# Patient Record
Sex: Male | Born: 1975 | Race: White | Hispanic: No | Marital: Married | Smoking: Current every day smoker
Health system: Southern US, Community
[De-identification: ages and names within clinical notes are randomized; demographics above are authoritative.]

## PROBLEM LIST (undated history)

## (undated) DIAGNOSIS — E785 Hyperlipidemia, unspecified: Secondary | ICD-10-CM

---

## 2009-09-03 ENCOUNTER — Ambulatory Visit: Payer: Self-pay | Admitting: Family Medicine

## 2009-09-03 DIAGNOSIS — E785 Hyperlipidemia, unspecified: Secondary | ICD-10-CM

## 2009-09-03 DIAGNOSIS — K047 Periapical abscess without sinus: Secondary | ICD-10-CM

## 2010-07-08 NOTE — Assessment & Plan Note (Signed)
Summary: NOV dental problems/ high cholesterol   Vital Signs:  Patient profile:   35 year old male Height:      73 inches Weight:      156 pounds BMI:     20.66 O2 Sat:      98 % on Room air Temp:     98.6 degrees F oral Pulse rate:   85 / minute BP sitting:   111 / 64  (left arm) Cuff size:   regular  Vitals Entered By: Payton Spark CMA (September 03, 2009 3:47 PM)  O2 Flow:  Room air CC: New to est. Impacted wisdom tooth infection and pain. Also needs cholesterol checked- has been off meds x 2 yrs.  Pain Assessment Patient in pain? yes     Location: teeth   Primary Care Provider:  Seymour Bars DO  CC:  New to est. Impacted wisdom tooth infection and pain. Also needs cholesterol checked- has been off meds x 2 yrs. .  History of Present Illness: 35 yo WM presents for NOV.  He has hx of hereditary hyperlipidemia.  He has poor dentition and has been w/o insurance for the past couple years.  He is currently having painful teeth on the upper L side alll the way up to his temple.  He finished a round of abx 2 wks ago for an abscess on the R side of his mouth.  He is going to get in with a dentist soon.  He had subjective fevers last night.  Using Excedrin for pain.  He had been on cholesterol meds since age 75 until he was 59.  He has done well on muliptle combined agents.  His father died of AMI at 95 and his PGF died of AMI at 73.  He is a smoker.  Has not had any cardiac testing done.        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 Divorced.  Lives with GF, her son and his brother. No kids. Smokes  1/2 ppds x 18 yrs. No regular exercise.  Review of Systems       no fevers/sweats/weakness, unexplained wt loss/gain, no change in vision, no difficulty hearing, ringing in ears, no hay fever/allergies, no CP/discomfort, no palpitations, no breast lump/nipple discharge, no cough/wheeze, no blood in stool, no N/V/D, no nocturia, no leaking urine, no unusual vag bleeding, no vaginal/penile discharge, no muscle/joint pain, no rash, no new/changing mole, no HA, no memory loss, no anxiety, no sleep problem, no depression, no unexplained lumps, no easy bruising/bleeding, no concern with sexual function   Physical Exam  General:  alert, well-developed, well-nourished, and well-hydrated.  thin build Head:  normocephalic, atraumatic, and no alopecia.   Eyes:  pupils equal, pupils round, and pupils reactive to light.   Nose:  no nasal discharge.   Mouth:  poor dentition and gingival inflammation.  abscess L lower side Neck:  no masses.   Lungs:  Normal respiratory effort, chest expands symmetrically. Lungs are clear to auscultation, no crackles or wheezes. Heart:  Normal rate and regular rhythm. S1 and S2 normal without gallop, murmur, click, rub or other extra sounds. Skin:  color normal.  Cervical Nodes:  No lymphadenopathy noted Psych:  good eye contact, not anxious appearing, and not depressed appearing.     Impression & Recommendations:  Problem # 1:  ABSCESS, TOOTH (ICD-522.5) Treat with Augmentin x 10 days for dental infection. Use Advil during the day and Vicodin at night for pain. He is going to call the dentist to get in ASAP.  Problem # 2:  HYPERLIPIDEMIA (ICD-272.4) Update labs and restart meds.  Due to his hx, he may be a good candidate for the lipid clinic thru [facility]  and with fam hx of ACS at such a young age, he will be due for ETT.  Strongly encouraged smokign cessation. Orders: T-Lipid Profile ([id number])  Complete Medication List: 1)  Augmentin 500-125 Mg Tabs  (Amoxicillin-pot clavulanate) .Marland Kitchen.. 1 tab by mouth two times a day x 10 days 2)  Vicodin 5-500 Mg Tabs (Hydrocodone-acetaminophen) .Marland Kitchen.. 1-2 tab by mouth once daily as needed for dental pain 3)  Advil 200 Mg Tabs (Ibuprofen) .... 4 tabs by mouth three times a day with food as needed for pain  Other Orders: T-Comprehensive Metabolic Panel ([id number])  Patient Instructions: 1)  Use 10 days of Augmentin for dental abscess. 2)  Use Advil 800 mg 3 x a day with food for pain and inflammation along with Hydrococodone at bedtime for pain. 3)  Use ice packs as needed. 4)  Update fasting labs. 5)  will call you w/ results. Prescriptions: VICODIN 5-500 MG TABS (HYDROCODONE-ACETAMINOPHEN) 1-2 tab by mouth once daily as needed for dental pain  #20 x 0   Entered and Authorized by:   Seymour Bars DO   Signed by:   Seymour Bars DO on 09/03/2009   Method used:   Printed then faxed to ...       [address] [phone]* (retail)       [address] [place], [place]  [address]       Ph: [phone]       Fax: [phone]   RxID:   [id number] AUGMENTIN 500-125 MG TABS (AMOXICILLIN-POT CLAVULANATE) 1 tab by mouth two times a day x 10 days  #20 x 0   Entered and Authorized by:   Seymour Bars DO   Signed by:   Seymour Bars DO on 09/03/2009   Method used:   Electronically to        [facility] [phone]* (retail)       [address] [place], [place]  [address]       Ph: [phone]       Fax: [phone]   RxID:   [id number]

## 2011-02-02 ENCOUNTER — Other Ambulatory Visit: Payer: Self-pay | Admitting: Family Medicine

## 2011-02-02 ENCOUNTER — Encounter: Payer: Self-pay | Admitting: Family Medicine

## 2011-02-02 ENCOUNTER — Ambulatory Visit
Admission: RE | Admit: 2011-02-02 | Discharge: 2011-02-02 | Disposition: A | Discharge status: Home / Self Care | Payer: PRIVATE HEALTH INSURANCE | Source: Ambulatory Visit | Attending: Family Medicine | Admitting: Family Medicine

## 2011-02-02 ENCOUNTER — Inpatient Hospital Stay (INDEPENDENT_AMBULATORY_CARE_PROVIDER_SITE_OTHER)
Admission: RE | Admit: 2011-02-02 | Discharge: 2011-02-02 | Disposition: A | Discharge status: Home / Self Care | Payer: PRIVATE HEALTH INSURANCE | Source: Ambulatory Visit | Attending: Family Medicine | Admitting: Family Medicine

## 2011-02-02 DIAGNOSIS — K5289 Other specified noninfective gastroenteritis and colitis: Secondary | ICD-10-CM

## 2011-02-02 DIAGNOSIS — R1032 Left lower quadrant pain: Secondary | ICD-10-CM

## 2011-02-02 LAB — CONVERTED CEMR LAB
Blood in Urine, dipstick: NEGATIVE
Glucose, Urine, Semiquant: NEGATIVE
Nitrite: NEGATIVE
Protein, U semiquant: NEGATIVE
Urobilinogen, UA: 0.2

## 2011-02-04 ENCOUNTER — Telehealth (INDEPENDENT_AMBULATORY_CARE_PROVIDER_SITE_OTHER): Payer: Self-pay | Admitting: *Deleted

## 2011-02-10 ENCOUNTER — Encounter: Payer: Self-pay | Admitting: Emergency Medicine

## 2011-02-10 ENCOUNTER — Inpatient Hospital Stay (INDEPENDENT_AMBULATORY_CARE_PROVIDER_SITE_OTHER)
Admission: RE | Admit: 2011-02-10 | Discharge: 2011-02-10 | Disposition: A | Discharge status: Home / Self Care | Payer: PRIVATE HEALTH INSURANCE | Source: Ambulatory Visit | Attending: Emergency Medicine | Admitting: Emergency Medicine

## 2011-02-10 DIAGNOSIS — J069 Acute upper respiratory infection, unspecified: Secondary | ICD-10-CM

## 2011-05-11 NOTE — Progress Notes (Signed)
Summary: possible kidney stone? (rm 4)   Vital Signs:  Patient Profile:   35 Years Old Male CC:      diarrhea, nausea, left lower abdominal pain x this AM Height:     73 inches Weight:      151 pounds O2 Sat:      100 % O2 treatment:    Room Air Temp:     97.8 degrees F oral Pulse rate:   79 / minute Resp:     16 per minute BP sitting:   122 / 78  (left arm) Cuff size:   regular  Pt. in pain?   yes    Location:   lower abdomen    Type:       sharp and dull  Vitals Entered By: Lajean Saver RN (February 02, 2011 9:54 AM)                   Prior Medication List:  AUGMENTIN 500-125 MG TABS (AMOXICILLIN-POT CLAVULANATE) 1 tab by mouth two times a day x 10 days VICODIN 5-500 MG TABS (HYDROCODONE-ACETAMINOPHEN) 1-2 tab by mouth once daily as needed for dental pain ADVIL 200 MG TABS (IBUPROFEN) 4 tabs by mouth three times a day with food as needed for pain   Updated Prior Medication List: LIPITOR 80 MG TABS (ATORVASTATIN CALCIUM)   Current Allergies: No known allergies History of Present Illness Chief Complaint: diarrhea, nausea, left lower abdominal pain x this AM History of Present Illness: Patient is here for episodic abdominal pain that started this AM . he states never having a  kidney stone before  but he has ben having episodic abdominal pain that comes in waves. The pain varies from a 10 to a 4 w/some nauseaand sdiarrhea. The pain has been on his lower left side. He denies any previous abdominal surgeries.  Current Problems: COLITIS, ACUTE (ICD-558.9) ABDOMINAL PAIN, LEFT LOWER QUADRANT (ICD-789.04) ABSCESS, TOOTH (ICD-522.5) OTH&UNSPEC ENDOCRN NUTRIT METAB&IMMUNITY D/O (ICD-V77.99) HYPERLIPIDEMIA (ICD-272.4)   Current Meds LIPITOR 80 MG TABS (ATORVASTATIN CALCIUM)  FLAGYL 500 MG TABS (METRONIDAZOLE) 1 by mouth twice a day AUGMENTIN 875-125 MG TABS (AMOXICILLIN-POT CLAVULANATE) 1 by mouth 2 times daily HYDROCODONE-ACETAMINOPHEN 5-325 MG TABS  (HYDROCODONE-ACETAMINOPHEN) 1 by mouth q 8hrs as needed for pain ZOFRAN ODT 8 MG TBDP (ONDANSETRON) 1 by mouth q 8hrs as needed for diarrhea  REVIEW OF SYSTEMS Constitutional Symptoms      Denies fever, chills, night sweats, weight loss, weight gain, and fatigue.  Eyes       Denies change in vision, eye pain, eye discharge, glasses, contact lenses, and eye surgery. Ear/Nose/Throat/Mouth       Denies hearing loss/aids, change in hearing, ear pain, ear discharge, dizziness, frequent runny nose, frequent nose bleeds, sinus problems, sore throat, hoarseness, and tooth pain or bleeding.  Respiratory       Denies dry cough, productive cough, wheezing, shortness of breath, asthma, bronchitis, and emphysema/COPD.  Cardiovascular       Denies murmurs, chest pain, and tires easily with exhertion.    Gastrointestinal       Complains of stomach pain and diarrhea.      Denies nausea/vomiting, constipation, blood in bowel movements, and indigestion.      Comments: Nausea Genitourniary       Denies painful urination, kidney stones, and loss of urinary control. Neurological       Denies paralysis, seizures, and fainting/blackouts. Musculoskeletal       Denies muscle pain, joint pain, joint stiffness,  decreased range of motion, redness, swelling, muscle weakness, and gout.  Skin       Denies bruising, unusual mles/lumps or sores, and hair/skin or nail changes.  Psych       Denies mood changes, temper/anger issues, anxiety/stress, speech problems, depression, and sleep problems. Other Comments: Patient awoke this AM with nausea and diarrhea. He c/o lower abdominal pain mostly on the left. pain is dull with sharp pains @ a 10/10 @ times   Past History:  Family History: Last updated: 09/22/2009 father AMI, died at 29, high cholesterol PGF died of AMI 35, high cholesterol M fibromyalgia 1/2 brother healthy  Social History: Last updated: 02/02/2011 [employer]. Moved here from  [place]. No kids. Smokes 1/2 ppds x 18 yrs. No regular exercise. Married  Past Medical History: Reviewed history from 09/22/09 and no changes required. hereditary hyperlipidemia periodontal dz  Past Surgical History: Reviewed history from 09/22/2009 and no changes required. t tubes  Family History: Reviewed history from 09-22-09 and no changes required. father AMI, died at 52, high cholesterol PGF died of AMI 15, high cholesterol M fibromyalgia 1/2 brother healthy  Social History: Reviewed history from 09/22/09 and no changes required. [employer]. Moved here from [place]. No kids. Smokes 1/2 ppds x 18 yrs. No regular exercise. Married Physical Exam General appearance: well developed, well nourished,mild to moderate discomfort Head: normocephalic, atraumatic Chest/Lungs: no rales, wheezes, or rhonchi bilateral, breath sounds equal without effort Heart: regular rate and  rhythm, no murmur Abdomen: tenderness in L lower Quadrant Back: no CVA tendrness Skin: no obvious rashes or lesions MSE: oriented to time, place, and person Assessment New Problems: COLITIS, ACUTE (ICD-558.9) ABDOMINAL PAIN, LEFT LOWER QUADRANT (ICD-789.04)   Patient Education: Patient and/or caregiver instructed in the following: rest fluids and Tylenol, quit smoking.  Plan New Medications/Changes: ZOFRAN ODT 8 MG TBDP (ONDANSETRON) 1 by mouth q 8hrs as needed for diarrhea  #20 x 0, 02/02/2011, Hassan Rowan MD HYDROCODONE-ACETAMINOPHEN 5-325 MG TABS (HYDROCODONE-ACETAMINOPHEN) 1 by mouth q 8hrs as needed for pain  #12 x 0, 02/02/2011, Hassan Rowan MD AUGMENTIN 210 154 0152 MG TABS (AMOXICILLIN-POT CLAVULANATE) 1 by mouth 2 times daily  #20 x 0, 02/02/2011, Hassan Rowan MD FLAGYL 500 MG TABS (METRONIDAZOLE) 1 by mouth twice a day  #14 x 0, 02/02/2011, Hassan Rowan MD ZOFRAN ODT 8 MG TBDP (ONDANSETRON) 1 by mouth q 8hrs as needed for diarrhea  #20 x 0, 02/02/2011, Hassan Rowan  MD HYDROCODONE-ACETAMINOPHEN 5-325 MG TABS (HYDROCODONE-ACETAMINOPHEN) 1 by mouth q 8hrs as needed for pain  #12 x 0, 02/02/2011, Hassan Rowan MD AUGMENTIN 875-125 MG TABS (AMOXICILLIN-POT CLAVULANATE) 1 by mouth 2 times daily  #20 x 0, 02/02/2011, Hassan Rowan MD FLAGYL 500 MG TABS (METRONIDAZOLE) 1 by mouth twice a day  #14 x 0, 02/02/2011, Hassan Rowan MD  New Orders: T-CT Abdomen/pelvis w/o [81191] UA Dipstick w/o Micro (manual) [81002] Ketorolac-Toradol 15mg  [J1885] Admin of Therapeutic Inj  intramuscular or subcutaneous [96372] Est. Patient Level IV [47829] CBC w/Diff [[id number]] Follow Up: Follow up in 2-3 days if no improvement Work/School Excuse: Return to work/school in 2 days  The patient and/or caregiver has been counseled thoroughly with regard to medications prescribed including dosage, schedule, interactions, rationale for use, and possible side effects and they verbalize understanding.  Diagnoses and expected course of recovery discussed and will return if not improved as expected or if the condition worsens. Patient and/or caregiver verbalized understanding.  Prescriptions: ZOFRAN ODT 8 MG TBDP (  ONDANSETRON) 1 by mouth q 8hrs as needed for diarrhea  #20 x 0   Entered and Authorized by:   Hassan Rowan MD   Signed by:   Hassan Rowan MD on 02/02/2011   Method used:   Printed then faxed to ...       [address] [phone]* (retail)       [address] [place], [place]  [address]       Ph: [phone]       Fax: [phone]   RxID:   [id number] HYDROCODONE-ACETAMINOPHEN 5-325 MG TABS (HYDROCODONE-ACETAMINOPHEN) 1 by mouth q 8hrs as needed for pain  #12 x 0   Entered and Authorized by:   Hassan Rowan MD   Signed by:   Hassan Rowan MD on 02/02/2011   Method used:   Printed then faxed to ...       [address] [phone]* (retail)       [address] [place], [place]  [address]       Ph: [phone]       Fax: [phone]   RxID:   [id number] AUGMENTIN  875-125 MG TABS (AMOXICILLIN-POT CLAVULANATE) 1 by mouth 2 times daily  #20 x 0   Entered and Authorized by:   Hassan Rowan MD   Signed by:   Hassan Rowan MD on 02/02/2011   Method used:   Printed then faxed to ...       [address] [phone]* (retail)       [address] [place], [place]  [address]       Ph: [phone]       Fax: [phone]   RxID:   [id number] FLAGYL 500 MG TABS (METRONIDAZOLE) 1 by mouth twice a day  #14 x 0   Entered and Authorized by:   Hassan Rowan MD   Signed by:   Hassan Rowan MD on 02/02/2011   Method used:   Printed then faxed to ...       [address] [phone]* (retail)       [address] [place], [place]  [address]       Ph: [phone]       Fax: [phone]   RxID:   [id number] ZOFRAN ODT 8 MG TBDP (ONDANSETRON) 1 by mouth q 8hrs as needed for diarrhea  #20 x 0   Entered and Authorized by:   Hassan Rowan MD   Signed by:   Hassan Rowan MD on 02/02/2011   Method used:   Printed then faxed to ...       [address] [phone]* (retail)       [address] [place], [place]  [address]       Ph: [phone]       Fax: [phone]   RxID:   [id number] HYDROCODONE-ACETAMINOPHEN 5-325 MG TABS (HYDROCODONE-ACETAMINOPHEN) 1 by mouth q 8hrs as needed for pain  #12 x 0   Entered and Authorized by:   Hassan Rowan MD   Signed by:   Hassan Rowan MD on 02/02/2011   Method used:   Printed then faxed to ...       [facility] [phone]* (retail)       [address] [place], [place]  [address]       Ph: [phone]       Fax: [phone]   RxID:   [id number] AUGMENTIN 875-125 MG TABS (AMOXICILLIN-POT CLAVULANATE) 1 by mouth 2 times daily  #20 x 0   Entered and Authorized by:   Hassan Rowan MD   Signed by:   Hassan Rowan MD on 02/02/2011   Method used:   Printed then faxed to ...       [address] [phone]* (retail)       [address] [place], [place]  [address]       Ph: [phone]       Fax: [phone]   RxID:    [id number] FLAGYL 500 MG TABS (METRONIDAZOLE) 1 by mouth twice a day  #14 x 0   Entered and Authorized by:   Hassan Rowan MD   Signed by:   Hassan Rowan MD on 02/02/2011   Method used:   Printed then faxed to ...       [address] [phone]* (retail)       [address] [place], [place]  [address]       Ph: [phone]       Fax: [phone]   RxID:   [id number]   Patient Instructions: 1)  Please schedule a follow-up appointment as needed. 2)  Please schedule an appointment with your primary doctor in :3-10 days if needed 3)  Treating the colitis w/antibiotic but more scans may be neeeded if not improving 4)  Tobacco is very bad for your health and your loved ones! You Should stop smoking!. 5)  Stop Smoking Tips: Choose a Quit date. Cut down before the Quit date. decide what you will do as a substitute when you feel the urge to smoke(gum,toothpick,exercise). 6)  Medically cleared to return to work; return to work note provided.  Medication Administration  Injection # 1:    Medication: Ketorolac-Toradol 15mg     Diagnosis: ABDOMINAL PAIN, LEFT LOWER QUADRANT (ICD-789.04)    Route: IM    Site: RUOQ gluteus    Exp Date: 08/06/2012    Lot #: [id number]    Mfr: hospira    Comments: 76m,g given    Patient tolerated injection without complications    Given by: Lajean Saver RN (February 02, 2011 10:56 AM)  Orders Added: 1)  T-CT Abdomen/pelvis w/o [94854] 2)  UA Dipstick w/o Micro (manual) [81002] 3)  Ketorolac-Toradol 15mg  [J1885] 4)  Admin of Therapeutic Inj  intramuscular or subcutaneous [96372] 5)  Est. Patient Level IV [62703] 6)  CBC w/Diff [[id number]]    Laboratory Results   Urine Tests  Date/Time Received: February 02, 2011 10:23 AM  Date/Time Reported: February 02, 2011 10:23 AM   Routine Urinalysis   Color: yellow Appearance: Clear Glucose: negative   (Normal Range: Negative) Bilirubin: negative   (Normal Range: Negative) Ketone: negative   (Normal  Range: Negative) Spec. Gravity: 1.025   (Normal Range: 1.003-1.035) Blood: negative   (Normal Range: Negative) pH: 5.5   (Normal Range: 5.0-8.0) Protein: negative   (Normal Range: Negative) Urobilinogen: 0.2   (Normal Range: 0-1) Nitrite: negative   (Normal Range: Negative) Leukocyte Esterace: negative   (Normal Range: Negative)

## 2011-05-11 NOTE — Progress Notes (Signed)
Summary: Fever/URI with sore throat (room 4)   Vital Signs:  Patient Profile:   35 Years Old Male CC:      URI/ sore throat Height:     73 inches Weight:      150 pounds O2 Sat:      97 % O2 treatment:    Room Air Temp:     100.3 degrees F oral Pulse rate:   18 / minute Resp:     18 per minute BP sitting:   123 / 85  (left arm) Cuff size:   regular  Pt. in pain?   yes    Location:   general body; throats  Vitals Entered By: Lavell Islam RN (February 10, 2011 5:55 PM)                   Updated Prior Medication List: LIPITOR 80 MG TABS (ATORVASTATIN CALCIUM)  FLAGYL 500 MG TABS (METRONIDAZOLE) 1 by mouth twice a day AUGMENTIN 875-125 MG TABS (AMOXICILLIN-POT CLAVULANATE) 1 by mouth 2 times daily HYDROCODONE-ACETAMINOPHEN 5-325 MG TABS (HYDROCODONE-ACETAMINOPHEN) 1 by mouth q 8hrs as needed for pain  Current Allergies: No known allergies History of Present Illness History from: patient Chief Complaint: URI/ sore throat History of Present Illness: 35 Years Old Male complains of onset of cold symptoms for 1 days.  Jonathan Anthony has been using Flagyl & Augmentin for his diverticulitis flare which helped him a lot and that pain is gone.  He is still taking those meds.  He has been around multiple people who have been sick. + sore throat No cough No pleuritic pain No wheezing + nasal congestion + post-nasal drainage + sinus pain/pressure No chest congestion No itchy/red eyes No earache No hemoptysis No SOB No chills/sweats + fever No nausea No vomiting No abdominal pain No diarrhea No skin rashes + fatigue + myalgias No headache   REVIEW OF SYSTEMS Constitutional Symptoms       Complains of fever and fatigue.     Denies chills, night sweats, weight loss, and weight gain.  Eyes       Complains of change in vision.      Denies eye pain, eye discharge, glasses, contact lenses, and eye surgery. Ear/Nose/Throat/Mouth       Complains of ear pain, frequent runny  nose, sinus problems, sore throat, and hoarseness.      Denies hearing loss/aids, change in hearing, ear discharge, dizziness, frequent nose bleeds, and tooth pain or bleeding.  Respiratory       Complains of dry cough.      Denies productive cough, wheezing, shortness of breath, asthma, bronchitis, and emphysema/COPD.  Cardiovascular       Denies murmurs, chest pain, and tires easily with exhertion.    Gastrointestinal       Complains of nausea/vomiting.      Denies stomach pain, diarrhea, constipation, blood in bowel movements, and indigestion. Genitourniary       Denies painful urination, kidney stones, and loss of urinary control. Neurological       Complains of numbness and tingling.      Denies paralysis, seizures, and fainting/blackouts. Musculoskeletal       Complains of joint stiffness and muscle weakness.      Denies muscle pain, joint pain, decreased range of motion, redness, swelling, and gout.  Skin       Denies bruising, unusual mles/lumps or sores, and hair/skin or nail changes.  Psych       Denies mood changes,  temper/anger issues, anxiety/stress, speech problems, depression, and sleep problems. Other Comments: URI and sore throat x 24 hours   Past History:  Past Medical History: Reviewed history from 09/03/2009 and no changes required. hereditary hyperlipidemia periodontal dz  Past Surgical History: Reviewed history from 09/03/2009 and no changes required. t tubes  Family History: Reviewed history from 09/03/2009 and no changes required. father AMI, died at 78, high cholesterol PGF died of AMI 79, high cholesterol M fibromyalgia 1/2 brother healthy  Social History: Reviewed history from 02/02/2011 and no changes required. [employer]. Moved here from [place]. No kids. Smokes 1/2 ppds x 18 yrs. No regular exercise. Physical Exam General appearance: well developed, well nourished, no acute distress Ears: normal, no lesions or  deformities Nasal: mucosa pink, nonedematous, no septal deviation, turbinates normal Oral/Pharynx: tongue normal, posterior pharynx without erythema or exudate Chest/Lungs: no rales, wheezes, or rhonchi bilateral, breath sounds equal without effort Heart: regular rate and  rhythm, no murmur MSE: oriented to time, place, and person Assessment New Problems: UPPER RESPIRATORY INFECTION, ACUTE (ICD-465.9)   Plan New Medications/Changes: PREDNISONE (PAK) 10 MG TABS (PREDNISONE) 6 day pack, use as directed  #1 x 0, 02/10/2011, Hoyt Koch MD  New Orders: Est. Patient Level III [91478] Rapid Strep [29562] Planning Comments:   1)  Rapid strep neg, as expected due to being on Augmentin already.  Likely viral URI.  Rx for Pred pack to hold and can take if getting worse. 2)  Use nasal saline solution (over the counter) at least 3 times a day. 3)  Use over the counter decongestants like Zyrtec-D every 12 hours as needed to help with congestion. 4)  Can take tylenol every 6 hours or motrin every 8 hours for pain or fever. 5)  Follow up with your primary doctor  if no improvement in 5-7 days, sooner if increasing pain, fever, or new symptoms.    The patient and/or caregiver has been counseled thoroughly with regard to medications prescribed including dosage, schedule, interactions, rationale for use, and possible side effects and they verbalize understanding.  Diagnoses and expected course of recovery discussed and will return if not improved as expected or if the condition worsens. Patient and/or caregiver verbalized understanding.  Prescriptions: PREDNISONE (PAK) 10 MG TABS (PREDNISONE) 6 day pack, use as directed  #1 x 0   Entered and Authorized by:   Hoyt Koch MD   Signed by:   Hoyt Koch MD on 02/10/2011   Method used:   Print then Give to Patient   RxID:   [id number]   Orders Added: 1)  Est. Patient Level III [84132] 2)  Rapid Strep [44010]    Laboratory  Results  Date/Time Received: February 10, 2011 6:02 PM  Date/Time Reported: February 10, 2011 6:02 PM   Other Tests  Rapid Strep: negative  Kit Test Internal QC: Negative   (Normal Range: Negative)

## 2011-05-11 NOTE — Letter (Signed)
Summary: Out of Work  [facility]  [address]   [place], [place] [address]   Phone: [phone]  Fax: [phone]    February 02, 2011   Employee:  Staci Righter    To Whom It May Concern:   For Medical reasons, please excuse the above named employee from work for the following dates:  Start:   02/02/2011  End:   02/04/2011  If you need additional information, please feel free to contact our office.         Sincerely,    Hassan Rowan MD

## 2011-05-11 NOTE — Telephone Encounter (Signed)
  Phone Note Outgoing Call Call back at Home Phone [phone] [facility]     Call placed by: Lajean Saver RN,  February 04, 2011 4:31 PM Call placed to: Patient Action Taken: Phone Call Completed Summary of Call: Callback:Patient reports he is taking the antibiotics and returned to work today.

## 2013-06-15 IMAGING — CT CT ABD-PELV W/O CM
2 of 4 series · 16 of 42 positions shown, 19 images · non-contrast
Comparison: None

CLINICAL DATA: Left lower quadrant abdominal pain.  Rule out
stones.

CT ABDOMEN AND PELVIS WITHOUT CONTRAST (CT UROGRAM)
TECHNIQUE: Contiguous axial images of the abdomen and pelvis
without oral or intravenous contrast were obtained.

[Series 2: renal stone w/o · axial · non-contrast · 0.70mm/px · z∈[-385,-45]mm · 13 of 77 slices shown, 16 images]
[im 6/77  soft-tissue]
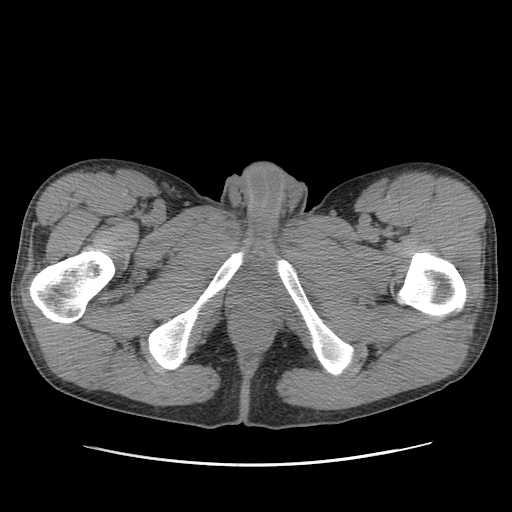
[im 6/77  bone]
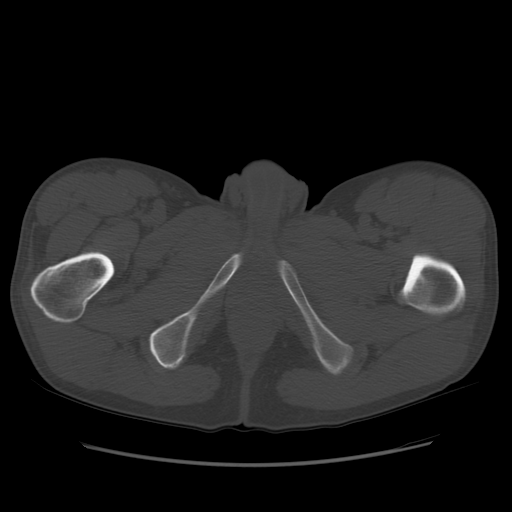
[im 14/77  soft-tissue]
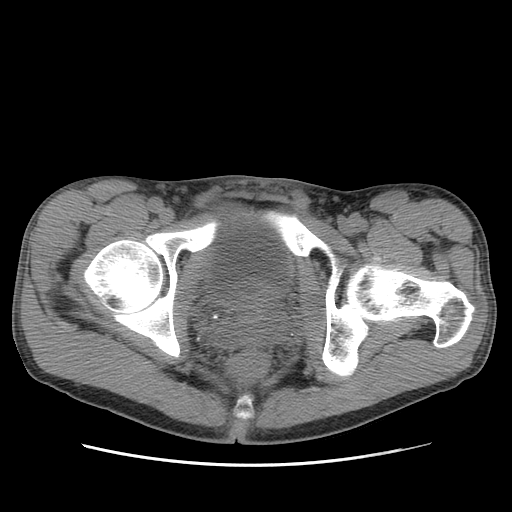
[im 20/77  soft-tissue]
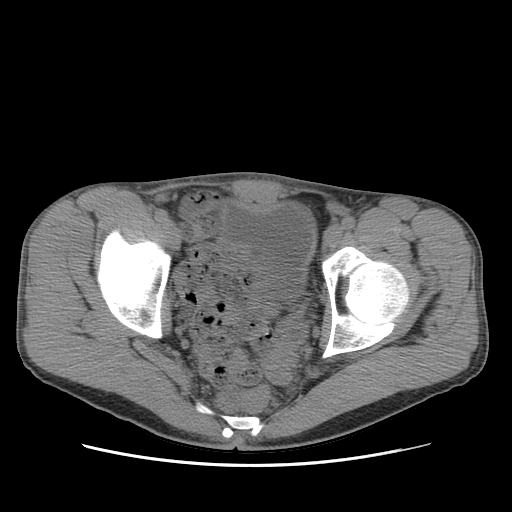
[im 28/77  soft-tissue]
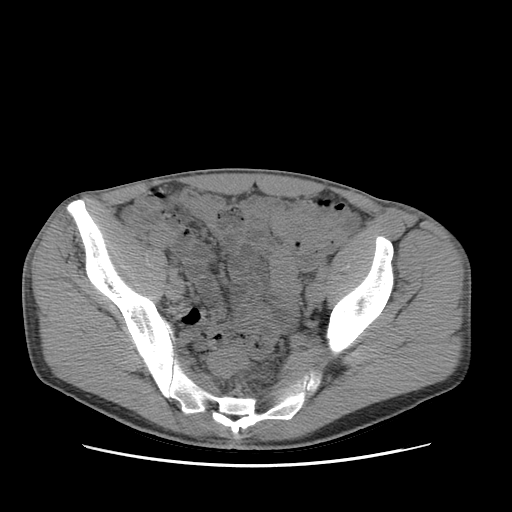
[im 36/77  soft-tissue]
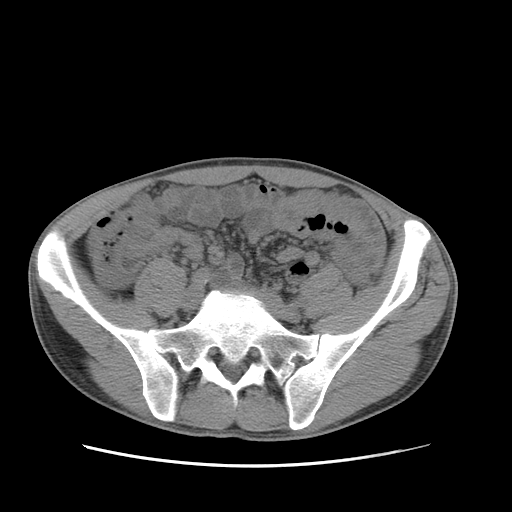
[im 41/77  soft-tissue]
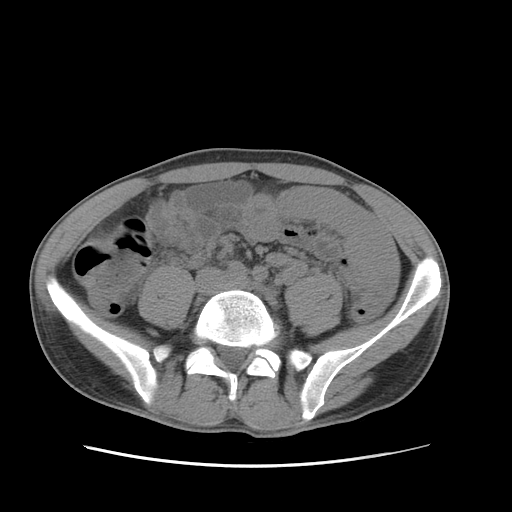
[im 49/77  soft-tissue]
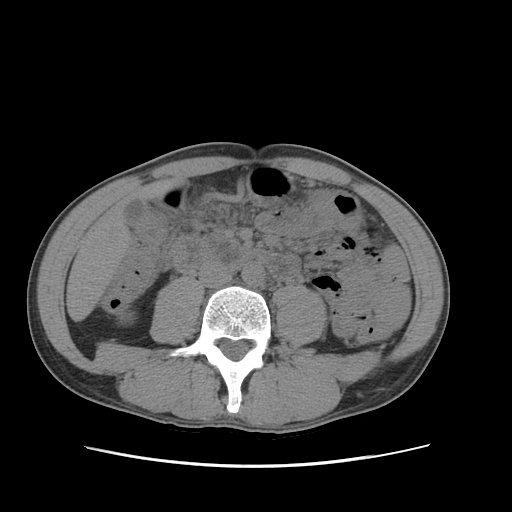
[im 58/77  soft-tissue]
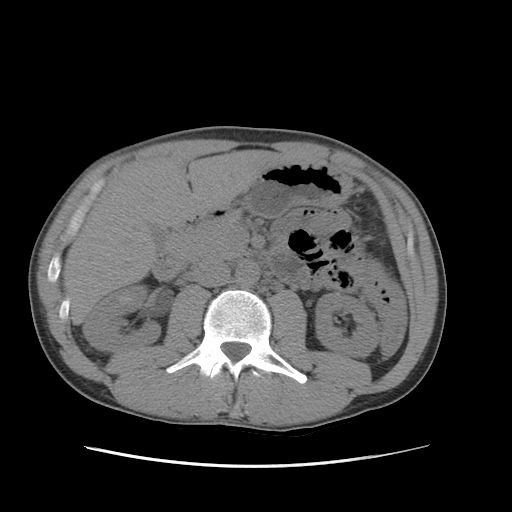
[im 63/77  soft-tissue]
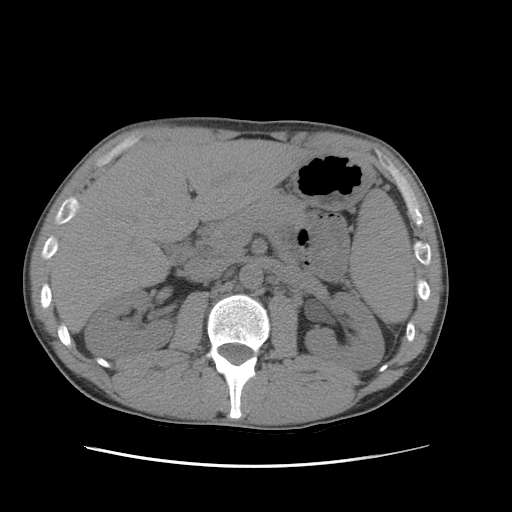
[im 63/77  bone]
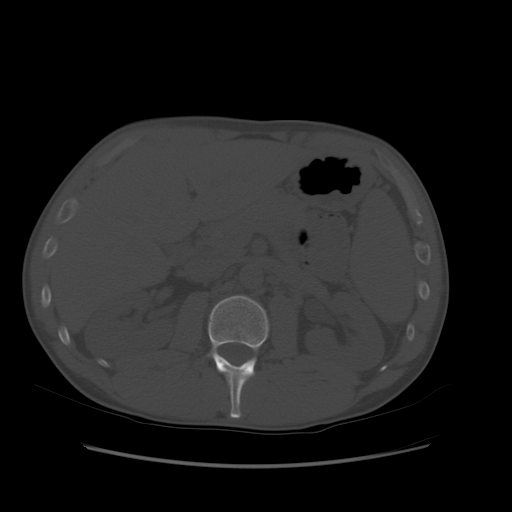
[im 66/77  lung]
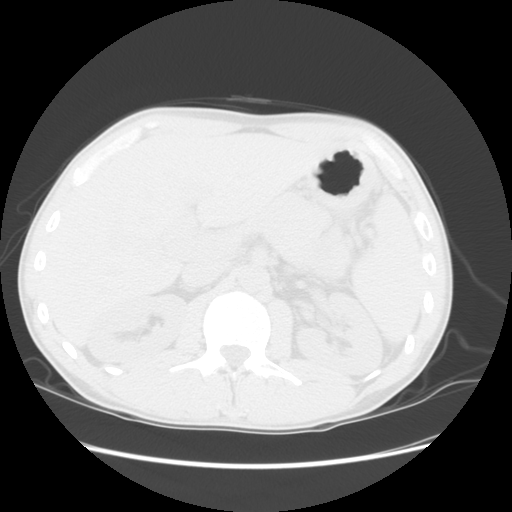
[im 68/77  lung]
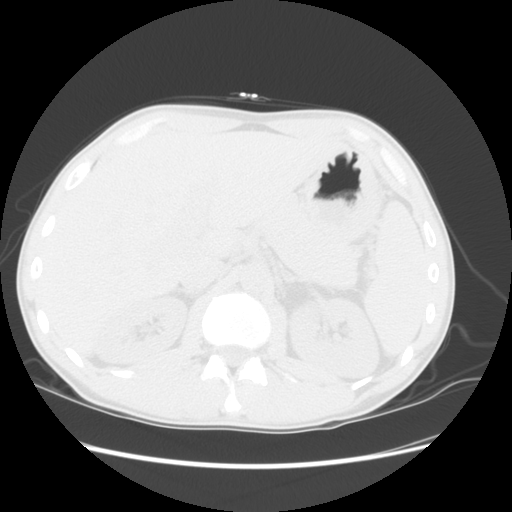
[im 71/77  soft-tissue]
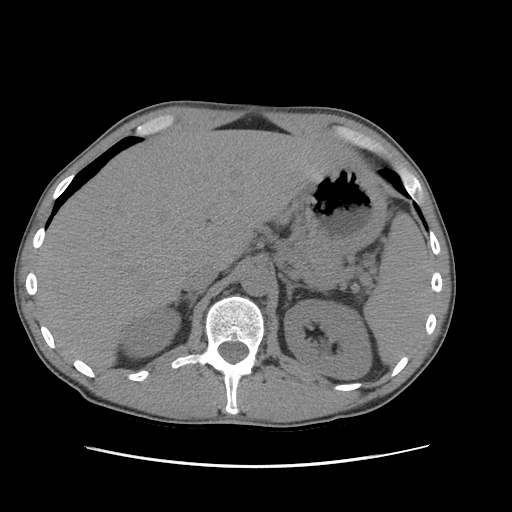
[im 71/77  lung]
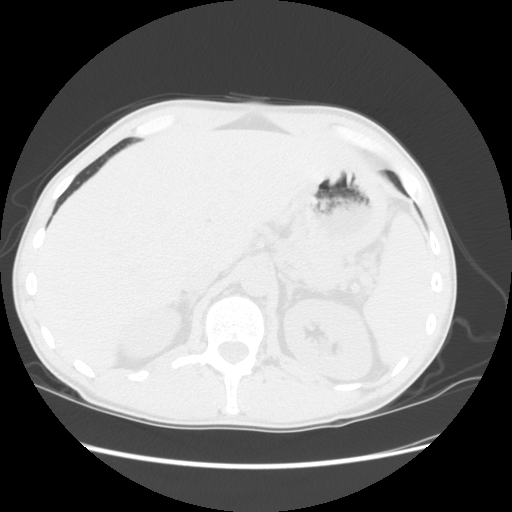
[im 74/77  lung]
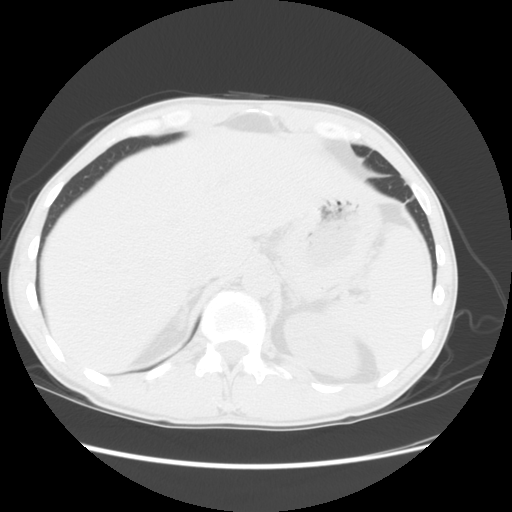

[Series 400: sag · sagittal · 0.80mm/px · 3 of 117 slices shown]
[im 30/117  soft-tissue]
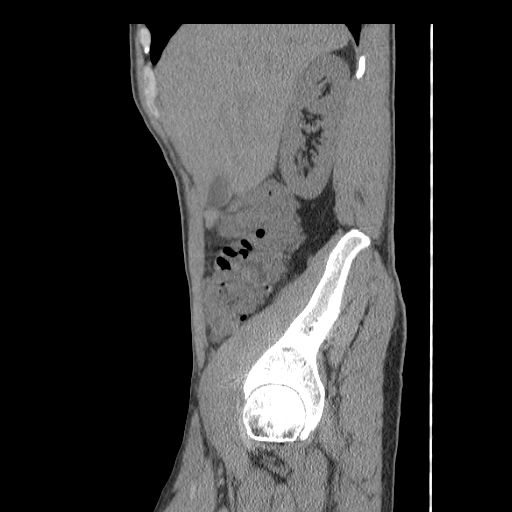
[im 59/117  soft-tissue]
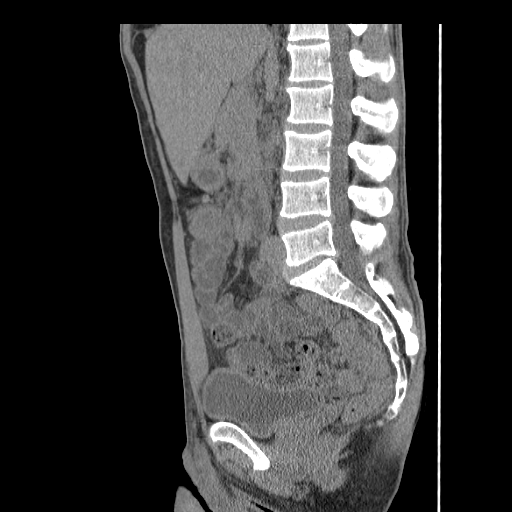
[im 88/117  soft-tissue]
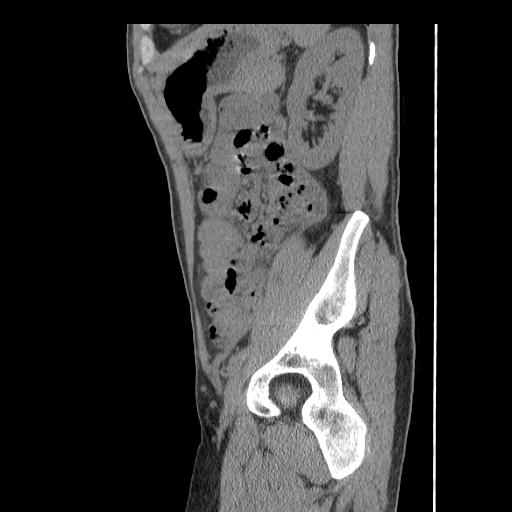

[16 of 42 positions shown; findings below may reference images not displayed]

FINDINGS: Exam is limited for evaluation of entities other than
urinary tract calculi due to lack of oral or intravenous contrast.

 Clear lung bases.  The liver and spleen are not imaged in their
entirety.  Focal steatosis adjacent the falciform ligament.  Normal
imaged portion of the spleen.  Normal stomach, pancreas,
gallbladder, biliary tract, adrenal glands.

No renal calculi or hydronephrosis.  A left extrarenal pelvis. No
hydroureter or ureteric calculi.  No retroperitoneal or retrocrural
adenopathy.

The rectum and sigmoid are underdistended and suboptimally
evaluated secondary to unenhanced technique.  The transverse colon
appears thick-walled on image 33.  This could be partially due to
underdistension. Normal terminal ileum and appendix.  Normal small
bowel without abdominal ascites.

Mild but age advanced right common iliac artery atherosclerosis.
Normal urinary bladder.  Increased density within the left side of
the prostate is likely due to nonspecific calcification on image
67. No significant free fluid.  Schmorl's node deformity at the
inferior endplate of L5.
IMPRESSION: 1.  No urinary tract calculi or hydronephrosis.
2.  The colon appears thick-walled.  Although this could be
partially due to underdistension, colitis cannot be excluded.  If
this is a clinical concern, consider dedicated contrast-enhanced
CT.
3.  Mild but age advanced atherosclerosis.

## 2016-08-03 ENCOUNTER — Encounter: Payer: Self-pay | Admitting: *Deleted

## 2016-08-03 ENCOUNTER — Emergency Department
Admission: EM | Admit: 2016-08-03 | Discharge: 2016-08-03 | Disposition: A | Discharge status: Home / Self Care | Payer: 59 | Source: Home / Self Care | Attending: Family Medicine | Admitting: Family Medicine

## 2016-08-03 DIAGNOSIS — R197 Diarrhea, unspecified: Secondary | ICD-10-CM

## 2016-08-03 DIAGNOSIS — K921 Melena: Secondary | ICD-10-CM

## 2016-08-03 HISTORY — DX: Hyperlipidemia, unspecified: E78.5

## 2016-08-03 LAB — POCT CBC W AUTO DIFF (K'VILLE URGENT CARE)

## 2016-08-03 NOTE — ED Triage Notes (Signed)
Pt c/o RLQ abd pain and diarrhea with a small amt of bright red blood x 4-5 hours. Denies fever, nausea or vomiting.

## 2016-08-03 NOTE — Discharge Instructions (Signed)
Begin clear liquids (Pedialyte while having diarrhea) until improved, then advance to a [employer] diet (Bananas, Rice, Applesauce, Toast).  Then gradually resume a regular diet when tolerated.  Avoid milk products until well.  When stools become more formed, may take Imodium (loperamide) once or twice daily to decrease stool frequency.  If symptoms become significantly worse during the night or over the weekend, proceed to the local emergency room.   Recommend followup with Dr. Loraine LericheMark Hix for evaluation of blood in stool.

## 2016-08-03 NOTE — ED Provider Notes (Signed)
[facility]    CSN: [id number] Arrival date & time: 08/03/16  [id number]     History   Chief Complaint Chief Complaint  Patient presents with  . Diarrhea  . Abdominal Pain    HPI Staci RighterJayson Spare is a 41 y.o. male.   At 4:35 am today patient awoke with watery diarrhea and right lower quadrant abdominal cramps.  The pain has been colicky, radiating to his left lower abdomen.  He seen small amounts of bright red blood in his diarrhea.  No nausea/vomiting.  He has had chills but no fever.  Denies recent foreign travel, or drinking untreated water in a wilderness environment.  He denies recent antibiotic use.  He states that his bowel movements are generally normal and he has had no recent changes.  He has had hemorrhoids in the past. Family history of GI problems mother (? IBS).   The history is provided by the patient.  Diarrhea  Quality:  Explosive and watery Severity:  Moderate Onset quality:  Sudden Number of episodes:  12 Duration:  7 hours Timing:  Sporadic Progression:  Partially resolved Relieved by:  None tried Worsened by:  Nothing Ineffective treatments:  None tried Associated symptoms: abdominal pain, chills and URI   Associated symptoms: no arthralgias, no recent cough, no diaphoresis, no fever, no headaches, no myalgias and no vomiting   Risk factors: no recent antibiotic use, no sick contacts, no suspicious food intake and no travel to endemic areas   Abdominal Pain  Associated symptoms: chills and diarrhea   Associated symptoms: no fever and no vomiting     Past Medical History:  Diagnosis Date  . Hyperlipidemia     Patient Active Problem List   Diagnosis Date Noted  . COLITIS, ACUTE 02/02/2011  . ABDOMINAL PAIN, LEFT LOWER QUADRANT 02/02/2011  . HYPERLIPIDEMIA 09/03/2009  . ABSCESS, TOOTH 09/03/2009    History reviewed. No pertinent surgical history.     Home Medications    Prior to Admission medications   Medication Sig Start Date End  Date Taking? Authorizing Provider  pravastatin (PRAVACHOL) 80 MG tablet Take 80 mg by mouth daily.   Yes Historical Provider, MD    Family History Family History  Problem Relation Age of Onset  . Heart attack Father   . Heart disease Father   . Hyperlipidemia Father   . Heart attack Paternal Grandfather   . Heart disease Paternal Grandfather   . Hyperlipidemia Paternal Grandfather     Social History Social History  Substance Use Topics  . Smoking status: Current Every Day Smoker    Packs/day: 1.00    Types: Cigarettes  . Smokeless tobacco: Never Used  . Alcohol use Yes     Comment: 6-10 q wk     Allergies   Patient has no known allergies.   Review of Systems Review of Systems  Constitutional: Positive for chills. Negative for diaphoresis and fever.  Gastrointestinal: Positive for abdominal pain and diarrhea. Negative for vomiting.  Musculoskeletal: Negative for arthralgias and myalgias.  Neurological: Negative for headaches.  All other systems reviewed and are negative.    Physical Exam Triage Vital Signs ED Triage Vitals  Enc Vitals Group     BP 08/03/16 1014 135/92     Pulse Rate 08/03/16 1014 82     Resp 08/03/16 1014 16     Temp 08/03/16 1014 98.3 F (36.8 C)     Temp Source 08/03/16 1014 Oral     SpO2 08/03/16 1014 100 %  Weight 08/03/16 1015 171 lb (77.6 kg)     Height 08/03/16 1015 6\' 1"  (1.854 m)     Head Circumference --      Peak Flow --      Pain Score 08/03/16 1021 2     Pain Loc --      Pain Edu? --      Excl. in GC? --    No data found.   Updated Vital Signs BP 135/92 (BP Location: Left Arm)   Pulse 82   Temp 98.3 F (36.8 C) (Oral)   Resp 16   Ht 6\' 1"  (1.854 m)   Wt 171 lb (77.6 kg)   SpO2 100%   BMI 22.56 kg/m   Visual Acuity Right Eye Distance:   Left Eye Distance:   Bilateral Distance:    Right Eye Near:   Left Eye Near:    Bilateral Near:     Physical Exam Nursing notes and Vital Signs reviewed. Appearance:   Patient appears stated age, and in no acute distress Eyes:  Pupils are equal, round, and reactive to light and accomodation.  Extraocular movement is intact.  Conjunctivae are not inflamed  Ears:  Normal externally. Nose:  Normal Pharynx:  Normal; moist mucous membranes  Neck:  Supple.  No adenopathy. Lungs:  Clear to auscultation.  Breath sounds are equal.  Moving air well. Heart:  Regular rate and rhythm without murmurs, rubs, or gallops.  Abdomen:   Vague mild lower quadrants tenderness without masses or hepatosplenomegaly.  Bowel sounds are present and increased.  No rebound.  No tenderness over McBurney's point.  No CVA or flank tenderness.  Extremities:  No edema.  Skin:  No rash present.    UC Treatments / Results  Labs (all labs ordered are listed, but only abnormal results are displayed) Labs Reviewed  POCT CBC W AUTO DIFF ([facility]):  WBC 10.2; LY 21.2; MO 3.6; GR 75.2; Hgb 16.9; Platelets 240     EKG  EKG Interpretation None       Radiology No results found.  Procedures Procedures (including critical care time)  Medications Ordered in UC Medications - No data to display   Initial Impression / Assessment and Plan / UC Course  I have reviewed the triage vital signs and the nursing notes.  Pertinent labs & imaging results that were available during my care of the patient were reviewed by me and considered in my medical decision making (see chart for details).    Normal WBC (10.2) and Hgb (16.9) reassuring. Suspect viral gastroenteritis. Concern for cause of hematochezia. Begin clear liquids (Pedialyte while having diarrhea) until improved, then advance to a [employer] (Bananas, Rice, Applesauce, Toast).  Then gradually resume a regular diet when tolerated.  Avoid milk products until well.  When stools become more formed, may take Imodium (loperamide) once or twice daily to decrease stool frequency.  If symptoms become significantly worse during the  night or over the weekend, proceed to the local emergency room.   Recommend followup with Dr. Loraine Leriche Hix for evaluation of blood in stool.    Final Clinical Impressions(s) / UC Diagnoses   Final diagnoses:  Diarrhea, unspecified type  Hematochezia    New Prescriptions New Prescriptions   No medications on file     Lattie Haw, MD 08/03/16 1130
